# Patient Record
Sex: Female | Born: 1955 | Race: White | Hispanic: No | Marital: Married | Smoking: Never smoker
Health system: Southern US, Community
[De-identification: ages and names within clinical notes are randomized; demographics above are authoritative.]

## PROBLEM LIST (undated history)

## (undated) DIAGNOSIS — M1990 Unspecified osteoarthritis, unspecified site: Secondary | ICD-10-CM

---

## 2014-01-18 ENCOUNTER — Encounter (HOSPITAL_COMMUNITY): Payer: Self-pay | Admitting: Pharmacy Technician

## 2014-01-18 ENCOUNTER — Encounter (HOSPITAL_COMMUNITY): Payer: Self-pay | Admitting: *Deleted

## 2014-01-27 NOTE — H&P (Signed)
TOTAL HIP ADMISSION H&P  Patient is admitted for left total hip arthroplasty, anterior approach.  Subjective:  Chief Complaint: Left hip OA / pain  HPI: Cheryl Stanley is a very pleasant 58 year old female here for evaluation of her left hip for consideration of anterior hip surgery. She has been having problems for years with progressive worsening. She has had previous intraarticular injection which only helped for 2-3 weeks. She recently had a fall on the lateral side of her hip and had a recent troch injection still having some discomfort with the lateral side of her left hip. She is very active with cycling and spin classes. She wants to remain as active as possible but is having limitations due to this pain. It basically is affecting all aspects of her life. She is a thin and healthy appearing female in no acute distress. She does walk with a Trendelenburg gait favoring this left hip. There is also questionable leg length issues on this left side compared to the right. Hip range of motion is limited and painful. Her right hip range of motion is more normal without significant limitations or with any pain. She is otherwise neurovascularly intact.  There is no current active infection.   Risks, benefits and expectations were discussed with the patient.  Risks including but not limited to the risk of anesthesia, blood clots, nerve damage, blood vessel damage, failure of the prosthesis, infection and up to and including death.  Patient understand the risks, benefits and expectations and wishes to proceed with surgery.   D/C Plans:   Home with HHPT  Post-op Meds:     No Rx given  Tranexamic Acid:   To be given  Decadron:    To be given  FYI:    ASA post-op  Norco post-op    Past Medical History  Diagnosis Date  . Arthritis     Past Surgical History  Procedure Laterality Date  . Cesarean section  1989    No prescriptions prior to admission   Not on File   History   Substance Use Topics  . Smoking status: Never Smoker   . Smokeless tobacco: Not on file  . Alcohol Use: Yes     Comment: occassionally-beer and wine    History reviewed. No pertinent family history.   Review of Systems  Constitutional: Negative.   HENT: Negative.   Eyes: Negative.   Respiratory: Negative.   Cardiovascular: Negative.   Gastrointestinal: Negative.   Genitourinary: Negative.   Musculoskeletal: Positive for joint pain.  Skin: Negative.   Neurological: Negative.   Endo/Heme/Allergies: Negative.   Psychiatric/Behavioral: Negative.     Objective:  Physical Exam  Constitutional: She is oriented to person, place, and time. She appears well-developed and well-nourished.  HENT:  Head: Normocephalic and atraumatic.  Mouth/Throat: Oropharynx is clear and moist.  Eyes: Pupils are equal, round, and reactive to light.  Neck: Neck supple. No JVD present. No tracheal deviation present. No thyromegaly present.  Cardiovascular: Normal rate, regular rhythm, normal heart sounds and intact distal pulses.   Respiratory: Effort normal and breath sounds normal. No stridor. No respiratory distress. She has no wheezes.  GI: Soft. There is no tenderness. There is no guarding.  Musculoskeletal:       Left hip: She exhibits decreased range of motion, decreased strength, tenderness and bony tenderness. She exhibits no swelling, no deformity and no laceration.  Lymphadenopathy:    She has no cervical adenopathy.  Neurological: She is alert and oriented  to person, place, and time.  Skin: Skin is warm and dry.  Psychiatric: She has a normal mood and affect.     Labs:  Estimated body mass index is 20.07 kg/(m^2) as calculated from the following:   Height as of this encounter: 5\' 4"  (1.626 m).   Weight as of this encounter: 53.071 kg (117 lb).   Imaging Review Advanced left hip osteoarthritis with loss of joint space in the superior lateral aspect. She has medial osteophytes that  have pushed her hip lateral. She has periarticular osteophytes throughout the hip joint. Her right hip does have moderate joint space narrowing that was reviewed with her today on her radiographs. We did do a single AP pelvis view today. Previous radiographs were not available.   Assessment/Plan:  End stage arthritis, left hip(s)  The patient history, physical examination, clinical judgement of the provider and imaging studies are consistent with end stage degenerative joint disease of the left hip(s) and total hip arthroplasty is deemed medically necessary. The treatment options including medical management, injection therapy, arthroscopy and arthroplasty were discussed at length. The risks and benefits of total hip arthroplasty were presented and reviewed. The risks due to aseptic loosening, infection, stiffness, dislocation/subluxation,  thromboembolic complications and other imponderables were discussed.  The patient acknowledged the explanation, agreed to proceed with the plan and consent was signed. Patient is being admitted for inpatient treatment for surgery, pain control, PT, OT, prophylactic antibiotics, VTE prophylaxis, progressive ambulation and ADL's and discharge planning.The patient is planning to be discharged home with home health services.    Anastasio Auerbach Pebble Botkin   PAC  01/27/2014, 2:43 PM

## 2014-01-29 ENCOUNTER — Inpatient Hospital Stay (HOSPITAL_COMMUNITY): Payer: BC Managed Care – PPO | Admitting: Anesthesiology

## 2014-01-29 ENCOUNTER — Inpatient Hospital Stay (HOSPITAL_COMMUNITY): Payer: BC Managed Care – PPO

## 2014-01-29 ENCOUNTER — Inpatient Hospital Stay (HOSPITAL_COMMUNITY)
Admission: RE | Admit: 2014-01-29 | Discharge: 2014-01-30 | DRG: 470 | Disposition: A | Discharge status: Home Health | Payer: BC Managed Care – PPO | Source: Ambulatory Visit | Attending: Orthopedic Surgery | Admitting: Orthopedic Surgery

## 2014-01-29 ENCOUNTER — Encounter (HOSPITAL_COMMUNITY): Payer: BC Managed Care – PPO | Admitting: Anesthesiology

## 2014-01-29 ENCOUNTER — Encounter (HOSPITAL_COMMUNITY): Payer: Self-pay | Admitting: *Deleted

## 2014-01-29 ENCOUNTER — Encounter (HOSPITAL_COMMUNITY)
Admission: RE | Disposition: A | Discharge status: Home Health | Payer: Self-pay | Source: Ambulatory Visit | Attending: Orthopedic Surgery

## 2014-01-29 DIAGNOSIS — M169 Osteoarthritis of hip, unspecified: Principal | ICD-10-CM | POA: Diagnosis present

## 2014-01-29 DIAGNOSIS — Z96649 Presence of unspecified artificial hip joint: Secondary | ICD-10-CM

## 2014-01-29 DIAGNOSIS — M1610 Unilateral primary osteoarthritis, unspecified hip: Principal | ICD-10-CM | POA: Diagnosis present

## 2014-01-29 DIAGNOSIS — D62 Acute posthemorrhagic anemia: Secondary | ICD-10-CM | POA: Diagnosis not present

## 2014-01-29 HISTORY — PX: TOTAL HIP ARTHROPLASTY: SHX124

## 2014-01-29 HISTORY — DX: Unspecified osteoarthritis, unspecified site: M19.90

## 2014-01-29 LAB — URINALYSIS, ROUTINE W REFLEX MICROSCOPIC
BILIRUBIN URINE: NEGATIVE
Glucose, UA: NEGATIVE mg/dL
Hgb urine dipstick: NEGATIVE
KETONES UR: NEGATIVE mg/dL
Leukocytes, UA: NEGATIVE
Nitrite: NEGATIVE
PH: 6.5 (ref 5.0–8.0)
Protein, ur: NEGATIVE mg/dL
Specific Gravity, Urine: 1.005 (ref 1.005–1.030)
UROBILINOGEN UA: 0.2 mg/dL (ref 0.0–1.0)

## 2014-01-29 LAB — BASIC METABOLIC PANEL
BUN: 17 mg/dL (ref 6–23)
CO2: 23 mEq/L (ref 19–32)
Calcium: 9.5 mg/dL (ref 8.4–10.5)
Chloride: 103 mEq/L (ref 96–112)
Creatinine, Ser: 0.73 mg/dL (ref 0.50–1.10)
Glucose, Bld: 103 mg/dL — ABNORMAL HIGH (ref 70–99)
Potassium: 4.1 mEq/L (ref 3.7–5.3)
SODIUM: 142 meq/L (ref 137–147)

## 2014-01-29 LAB — CBC
HCT: 41.2 % (ref 36.0–46.0)
Hemoglobin: 14.2 g/dL (ref 12.0–15.0)
MCH: 29.8 pg (ref 26.0–34.0)
MCHC: 34.5 g/dL (ref 30.0–36.0)
MCV: 86.6 fL (ref 78.0–100.0)
PLATELETS: 218 10*3/uL (ref 150–400)
RBC: 4.76 MIL/uL (ref 3.87–5.11)
RDW: 12.6 % (ref 11.5–15.5)
WBC: 6.4 10*3/uL (ref 4.0–10.5)

## 2014-01-29 LAB — SURGICAL PCR SCREEN
MRSA, PCR: NEGATIVE
STAPHYLOCOCCUS AUREUS: NEGATIVE

## 2014-01-29 LAB — PROTIME-INR
INR: 1.04 (ref 0.00–1.49)
PROTHROMBIN TIME: 13.4 s (ref 11.6–15.2)

## 2014-01-29 LAB — TYPE AND SCREEN
ABO/RH(D): B POS
Antibody Screen: NEGATIVE

## 2014-01-29 LAB — ABO/RH: ABO/RH(D): B POS

## 2014-01-29 LAB — APTT: aPTT: 27 seconds (ref 24–37)

## 2014-01-29 SURGERY — ARTHROPLASTY, HIP, TOTAL, ANTERIOR APPROACH
Anesthesia: Spinal | Site: Hip | Laterality: Left

## 2014-01-29 MED ORDER — 0.9 % SODIUM CHLORIDE (POUR BTL) OPTIME
TOPICAL | Status: DC | PRN
  Administered 2014-01-29: 1000 mL

## 2014-01-29 MED ORDER — HYDROCODONE-ACETAMINOPHEN 7.5-325 MG PO TABS
1.0000 | ORAL_TABLET | ORAL | Status: DC
  Administered 2014-01-29 – 2014-01-30 (×2): 2 via ORAL
  Filled 2014-01-29 (×4): qty 2

## 2014-01-29 MED ORDER — CEFAZOLIN SODIUM-DEXTROSE 2-3 GM-% IV SOLR
2.0000 g | INTRAVENOUS | Status: AC
  Administered 2014-01-29: 2 g via INTRAVENOUS

## 2014-01-29 MED ORDER — TRANEXAMIC ACID 100 MG/ML IV SOLN
1000.0000 mg | Freq: Once | INTRAVENOUS | Status: AC
  Administered 2014-01-29: 1000 mg via INTRAVENOUS
  Filled 2014-01-29: qty 10

## 2014-01-29 MED ORDER — CEFAZOLIN SODIUM-DEXTROSE 2-3 GM-% IV SOLR
2.0000 g | Freq: Four times a day (QID) | INTRAVENOUS | Status: AC
  Administered 2014-01-30 (×2): 2 g via INTRAVENOUS
  Filled 2014-01-29 (×2): qty 50

## 2014-01-29 MED ORDER — METOCLOPRAMIDE HCL 10 MG PO TABS: 5.0000 mg | ORAL_TABLET | Freq: Three times a day (TID) | ORAL | Status: DC | PRN

## 2014-01-29 MED ORDER — HYDROMORPHONE HCL PF 1 MG/ML IJ SOLN
0.2500 mg | INTRAMUSCULAR | Status: DC | PRN
  Administered 2014-01-29: 0.25 mg via INTRAVENOUS
  Administered 2014-01-29: 0.5 mg via INTRAVENOUS

## 2014-01-29 MED ORDER — PROPOFOL INFUSION 10 MG/ML OPTIME
INTRAVENOUS | Status: DC | PRN
  Administered 2014-01-29: 100 ug/kg/min via INTRAVENOUS

## 2014-01-29 MED ORDER — FENTANYL CITRATE 0.05 MG/ML IJ SOLN
INTRAMUSCULAR | Status: DC | PRN
  Administered 2014-01-29: 100 ug via INTRAVENOUS

## 2014-01-29 MED ORDER — HYDROMORPHONE HCL PF 1 MG/ML IJ SOLN
INTRAMUSCULAR | Status: AC
  Filled 2014-01-29: qty 1

## 2014-01-29 MED ORDER — PROPOFOL 10 MG/ML IV BOLUS
INTRAVENOUS | Status: AC
  Filled 2014-01-29: qty 20

## 2014-01-29 MED ORDER — LACTATED RINGERS IV SOLN
INTRAVENOUS | Status: DC
  Administered 2014-01-29 (×2): via INTRAVENOUS
  Administered 2014-01-29: 1000 mL via INTRAVENOUS

## 2014-01-29 MED ORDER — MIDAZOLAM HCL 5 MG/5ML IJ SOLN
INTRAMUSCULAR | Status: DC | PRN
  Administered 2014-01-29: 2 mg via INTRAVENOUS

## 2014-01-29 MED ORDER — DEXAMETHASONE SODIUM PHOSPHATE 10 MG/ML IJ SOLN
10.0000 mg | Freq: Once | INTRAMUSCULAR | Status: AC
  Administered 2014-01-29: 10 mg via INTRAVENOUS

## 2014-01-29 MED ORDER — CEFAZOLIN SODIUM-DEXTROSE 2-3 GM-% IV SOLR
INTRAVENOUS | Status: AC
  Filled 2014-01-29: qty 50

## 2014-01-29 MED ORDER — BIOTIN 5000 MCG PO CAPS: 5000.0000 ug | ORAL_CAPSULE | Freq: Every morning | ORAL | Status: DC

## 2014-01-29 MED ORDER — BISACODYL 10 MG RE SUPP
10.0000 mg | Freq: Every day | RECTAL | Status: DC | PRN
Start: 2014-01-29 — End: 2014-01-30

## 2014-01-29 MED ORDER — METOCLOPRAMIDE HCL 5 MG/ML IJ SOLN
5.0000 mg | Freq: Three times a day (TID) | INTRAMUSCULAR | Status: DC | PRN
  Administered 2014-01-30: 10 mg via INTRAVENOUS
  Filled 2014-01-29: qty 2

## 2014-01-29 MED ORDER — BUPIVACAINE HCL (PF) 0.75 % IJ SOLN
INTRAMUSCULAR | Status: DC | PRN
  Administered 2014-01-29: 2 mL

## 2014-01-29 MED ORDER — ONDANSETRON HCL 4 MG/2ML IJ SOLN
INTRAMUSCULAR | Status: DC | PRN
  Administered 2014-01-29: 4 mg via INTRAVENOUS

## 2014-01-29 MED ORDER — SODIUM CHLORIDE 0.9 % IV SOLN
100.0000 mL/h | INTRAVENOUS | Status: DC
  Administered 2014-01-29: 100 mL/h via INTRAVENOUS
  Filled 2014-01-29 (×3): qty 1000

## 2014-01-29 MED ORDER — PHENYLEPHRINE HCL 10 MG/ML IJ SOLN
INTRAMUSCULAR | Status: DC | PRN
  Administered 2014-01-29: 40 ug via INTRAVENOUS
  Administered 2014-01-29: 80 ug via INTRAVENOUS

## 2014-01-29 MED ORDER — DOCUSATE SODIUM 100 MG PO CAPS
100.0000 mg | ORAL_CAPSULE | Freq: Two times a day (BID) | ORAL | Status: DC
  Administered 2014-01-29 – 2014-01-30 (×2): 100 mg via ORAL

## 2014-01-29 MED ORDER — HYDROMORPHONE HCL PF 1 MG/ML IJ SOLN
0.5000 mg | INTRAMUSCULAR | Status: DC | PRN
  Administered 2014-01-30: 1 mg via INTRAVENOUS
  Filled 2014-01-29: qty 1

## 2014-01-29 MED ORDER — STERILE WATER FOR IRRIGATION IR SOLN
Status: DC | PRN
  Administered 2014-01-29: 1500 mL

## 2014-01-29 MED ORDER — METHOCARBAMOL 100 MG/ML IJ SOLN
500.0000 mg | Freq: Four times a day (QID) | INTRAVENOUS | Status: DC | PRN
  Administered 2014-01-29: 500 mg via INTRAVENOUS
  Filled 2014-01-29: qty 5

## 2014-01-29 MED ORDER — ONDANSETRON HCL 4 MG/2ML IJ SOLN
INTRAMUSCULAR | Status: AC
  Filled 2014-01-29: qty 2

## 2014-01-29 MED ORDER — ALUM & MAG HYDROXIDE-SIMETH 200-200-20 MG/5ML PO SUSP: 30.0000 mL | ORAL | Status: DC | PRN

## 2014-01-29 MED ORDER — FLEET ENEMA 7-19 GM/118ML RE ENEM: 1.0000 | ENEMA | Freq: Once | RECTAL | Status: AC | PRN

## 2014-01-29 MED ORDER — METHOCARBAMOL 500 MG PO TABS
500.0000 mg | ORAL_TABLET | Freq: Four times a day (QID) | ORAL | Status: DC | PRN
  Administered 2014-01-30: 500 mg via ORAL
  Filled 2014-01-29: qty 1

## 2014-01-29 MED ORDER — PHENOL 1.4 % MT LIQD: 1.0000 | OROMUCOSAL | Status: DC | PRN

## 2014-01-29 MED ORDER — CHLORHEXIDINE GLUCONATE 4 % EX LIQD: 60.0000 mL | Freq: Once | CUTANEOUS | Status: DC

## 2014-01-29 MED ORDER — CELECOXIB 200 MG PO CAPS
200.0000 mg | ORAL_CAPSULE | Freq: Two times a day (BID) | ORAL | Status: DC
  Administered 2014-01-29 – 2014-01-30 (×2): 200 mg via ORAL
  Filled 2014-01-29 (×3): qty 1

## 2014-01-29 MED ORDER — PROMETHAZINE HCL 25 MG/ML IJ SOLN: 6.2500 mg | INTRAMUSCULAR | Status: DC | PRN

## 2014-01-29 MED ORDER — DEXAMETHASONE SODIUM PHOSPHATE 10 MG/ML IJ SOLN
10.0000 mg | Freq: Once | INTRAMUSCULAR | Status: DC
  Filled 2014-01-29: qty 1

## 2014-01-29 MED ORDER — FENTANYL CITRATE 0.05 MG/ML IJ SOLN
INTRAMUSCULAR | Status: AC
  Filled 2014-01-29: qty 2

## 2014-01-29 MED ORDER — MUPIROCIN 2 % EX OINT
TOPICAL_OINTMENT | Freq: Two times a day (BID) | CUTANEOUS | Status: DC
Start: 2014-01-29 — End: 2014-01-29
  Administered 2014-01-29: 1 via NASAL
  Filled 2014-01-29: qty 22

## 2014-01-29 MED ORDER — ZOLPIDEM TARTRATE 5 MG PO TABS: 5.0000 mg | ORAL_TABLET | Freq: Every evening | ORAL | Status: DC | PRN

## 2014-01-29 MED ORDER — MENTHOL 3 MG MT LOZG
1.0000 | LOZENGE | OROMUCOSAL | Status: DC | PRN
Start: 2014-01-29 — End: 2014-01-30

## 2014-01-29 MED ORDER — MIDAZOLAM HCL 2 MG/2ML IJ SOLN
INTRAMUSCULAR | Status: AC
  Filled 2014-01-29: qty 2

## 2014-01-29 MED ORDER — ONDANSETRON HCL 4 MG PO TABS
4.0000 mg | ORAL_TABLET | Freq: Four times a day (QID) | ORAL | Status: DC | PRN
  Administered 2014-01-30: 4 mg via ORAL
  Filled 2014-01-29: qty 1

## 2014-01-29 MED ORDER — FERROUS SULFATE 325 (65 FE) MG PO TABS
325.0000 mg | ORAL_TABLET | Freq: Three times a day (TID) | ORAL | Status: DC
  Filled 2014-01-29 (×4): qty 1

## 2014-01-29 MED ORDER — ONDANSETRON HCL 4 MG/2ML IJ SOLN
4.0000 mg | Freq: Four times a day (QID) | INTRAMUSCULAR | Status: DC | PRN
  Administered 2014-01-30: 4 mg via INTRAVENOUS
  Filled 2014-01-29: qty 2

## 2014-01-29 MED ORDER — DIPHENHYDRAMINE HCL 25 MG PO CAPS: 25.0000 mg | ORAL_CAPSULE | Freq: Four times a day (QID) | ORAL | Status: DC | PRN

## 2014-01-29 MED ORDER — POLYETHYLENE GLYCOL 3350 17 G PO PACK
17.0000 g | PACK | Freq: Two times a day (BID) | ORAL | Status: DC
  Administered 2014-01-29 – 2014-01-30 (×2): 17 g via ORAL

## 2014-01-29 MED ORDER — PHENYLEPHRINE 40 MCG/ML (10ML) SYRINGE FOR IV PUSH (FOR BLOOD PRESSURE SUPPORT)
PREFILLED_SYRINGE | INTRAVENOUS | Status: AC
  Filled 2014-01-29: qty 10

## 2014-01-29 MED ORDER — ASPIRIN EC 325 MG PO TBEC
325.0000 mg | DELAYED_RELEASE_TABLET | Freq: Two times a day (BID) | ORAL | Status: DC
  Administered 2014-01-30: 325 mg via ORAL
  Filled 2014-01-29 (×3): qty 1

## 2014-01-29 SURGICAL SUPPLY — 38 items
BAG ZIPLOCK 12X15 (MISCELLANEOUS) IMPLANT
BLADE SAW SGTL 18X1.27X75 (BLADE) ×2 IMPLANT
BLADE SAW SGTL 18X1.27X75MM (BLADE) ×1
CAPT HIP PF COP ×3 IMPLANT
DERMABOND ADVANCED (GAUZE/BANDAGES/DRESSINGS) ×2
DERMABOND ADVANCED .7 DNX12 (GAUZE/BANDAGES/DRESSINGS) ×1 IMPLANT
DRAPE C-ARM 42X120 X-RAY (DRAPES) ×3 IMPLANT
DRAPE STERI IOBAN 125X83 (DRAPES) ×3 IMPLANT
DRAPE U-SHAPE 47X51 STRL (DRAPES) ×9 IMPLANT
DRSG AQUACEL AG ADV 3.5X10 (GAUZE/BANDAGES/DRESSINGS) ×3 IMPLANT
DRSG TEGADERM 4X4.75 (GAUZE/BANDAGES/DRESSINGS) IMPLANT
DURAPREP 26ML APPLICATOR (WOUND CARE) ×3 IMPLANT
ELECT BLADE TIP CTD 4 INCH (ELECTRODE) ×3 IMPLANT
ELECT REM PT RETURN 9FT ADLT (ELECTROSURGICAL) ×3
ELECTRODE REM PT RTRN 9FT ADLT (ELECTROSURGICAL) ×1 IMPLANT
EVACUATOR 1/8 PVC DRAIN (DRAIN) IMPLANT
FACESHIELD LNG OPTICON STERILE (SAFETY) ×12 IMPLANT
GAUZE SPONGE 2X2 8PLY STRL LF (GAUZE/BANDAGES/DRESSINGS) IMPLANT
GLOVE BIOGEL PI IND STRL 7.5 (GLOVE) ×1 IMPLANT
GLOVE BIOGEL PI IND STRL 8 (GLOVE) ×1 IMPLANT
GLOVE BIOGEL PI INDICATOR 7.5 (GLOVE) ×2
GLOVE BIOGEL PI INDICATOR 8 (GLOVE) ×2
GLOVE ECLIPSE 8.0 STRL XLNG CF (GLOVE) ×3 IMPLANT
GLOVE ORTHO TXT STRL SZ7.5 (GLOVE) ×6 IMPLANT
GOWN SPEC L3 XXLG W/TWL (GOWN DISPOSABLE) ×3 IMPLANT
GOWN STRL REUS W/TWL LRG LVL3 (GOWN DISPOSABLE) ×3 IMPLANT
KIT BASIN OR (CUSTOM PROCEDURE TRAY) ×3 IMPLANT
PACK TOTAL JOINT (CUSTOM PROCEDURE TRAY) ×3 IMPLANT
PADDING CAST COTTON 6X4 STRL (CAST SUPPLIES) ×3 IMPLANT
SPONGE GAUZE 2X2 STER 10/PKG (GAUZE/BANDAGES/DRESSINGS)
SUT MNCRL AB 4-0 PS2 18 (SUTURE) ×3 IMPLANT
SUT VIC AB 1 CT1 36 (SUTURE) ×9 IMPLANT
SUT VIC AB 2-0 CT1 27 (SUTURE) ×4
SUT VIC AB 2-0 CT1 TAPERPNT 27 (SUTURE) ×2 IMPLANT
SUT VLOC 180 0 24IN GS25 (SUTURE) ×3 IMPLANT
TOWEL OR 17X26 10 PK STRL BLUE (TOWEL DISPOSABLE) ×3 IMPLANT
TOWEL OR NON WOVEN STRL DISP B (DISPOSABLE) IMPLANT
TRAY FOLEY CATH 14FRSI W/METER (CATHETERS) IMPLANT

## 2014-01-29 NOTE — Progress Notes (Signed)
PHARMACIST - PHYSICIAN ORDER COMMUNICATION  CONCERNING: P&T Medication Policy on Herbal Medications  DESCRIPTION:  This patient's order for:  Biotin  has been noted.  This product(s) is classified as an "herbal" or natural product. Due to a lack of definitive safety studies or FDA approval, nonstandard manufacturing practices, plus the potential risk of unknown drug-drug interactions while on inpatient medications, the Pharmacy and Therapeutics Committee does not permit the use of "herbal" or natural products of this type within [facility].   ACTION TAKEN: The pharmacy department is unable to verify this order at this time and your patient has been informed of this safety policy. Please reevaluate patient's clinical condition at discharge and address if the herbal or natural product(s) should be resumed at that time.  Lorenza EvangelistGreen, Okechukwu Regnier R 01/29/2014 10:36 PM

## 2014-01-29 NOTE — Anesthesia Postprocedure Evaluation (Signed)
  Anesthesia Post-op Note  Patient: Cheryl Stanley  Procedure(s) Performed: Procedure(s) (LRB): LEFT TOTAL HIP ARTHROPLASTY ANTERIOR APPROACH (Left)  Patient Location: PACU  Anesthesia Type: Spinal  Level of Consciousness: awake and alert   Airway and Oxygen Therapy: Patient Spontanous Breathing  Post-op Pain: mild  Post-op Assessment: Post-op Vital signs reviewed, Patient's Cardiovascular Status Stable, Respiratory Function Stable, Patent Airway and No signs of Nausea or vomiting  Last Vitals:  Filed Vitals:   01/29/14 1926  BP: 115/76  Pulse: 61  Temp: 36.4 C  Resp: 16    Post-op Vital Signs: stable   Complications: No apparent anesthesia complications

## 2014-01-29 NOTE — Anesthesia Procedure Notes (Signed)
Spinal  Patient location during procedure: OR Start time: 01/29/2014 5:25 PM Staffing Anesthesiologist: ROSE, Greggory StallionGEORGE CRNA/Resident: [place]UST[place], Schuyler Behan C Performed by: resident/CRNA  Preanesthetic Checklist Completed: patient identified, site marked, surgical consent, pre-op evaluation, timeout performed, IV checked, risks and benefits discussed and monitors and equipment checked Spinal Block Patient position: sitting Prep: Betadine Patient monitoring: heart rate, cardiac monitor, continuous pulse ox and blood pressure Approach: midline Location: L3-4 Injection technique: single-shot Needle Needle type: Sprotte  Needle gauge: 24 G Assessment Sensory level: T6

## 2014-01-29 NOTE — Interval H&P Note (Signed)
History and Physical Interval Note:  01/29/2014 4:23 PM  Cheryl Barmanatricia Toste  has presented today for surgery, with the diagnosis of OSTEOARTHRITIS LEFT HIP  The various methods of treatment have been discussed with the patient and family. After consideration of risks, benefits and other options for treatment, the patient has consented to  Procedure(s): LEFT TOTAL HIP ARTHROPLASTY ANTERIOR APPROACH (Left) as a surgical intervention .  The patient's history has been reviewed, patient examined, no change in status, stable for surgery.  I have reviewed the patient's chart and labs.  Questions were answered to the patient's satisfaction.     Shelda PalLIN,Adlai Sinning D

## 2014-01-29 NOTE — Anesthesia Preprocedure Evaluation (Signed)
Anesthesia Evaluation  Patient identified by MRN, date of birth, ID band Patient awake    Reviewed: Allergy & Precautions, H&P , NPO status , Patient's Chart, lab work & pertinent test results  Airway Mallampati: II TM Distance: >3 FB Neck ROM: Full    Dental no notable dental hx.    Pulmonary neg pulmonary ROS,  breath sounds clear to auscultation  Pulmonary exam normal       Cardiovascular negative cardio ROS  Rhythm:Regular Rate:Normal     Neuro/Psych negative neurological ROS  negative psych ROS   GI/Hepatic negative GI ROS, Neg liver ROS,   Endo/Other  negative endocrine ROS  Renal/GU negative Renal ROS  negative genitourinary   Musculoskeletal negative musculoskeletal ROS (+)   Abdominal   Peds negative pediatric ROS (+)  Hematology negative hematology ROS (+)   Anesthesia Other Findings   Reproductive/Obstetrics negative OB ROS                           Anesthesia Physical Anesthesia Plan  ASA: I  Anesthesia Plan: Spinal   Post-op Pain Management:    Induction: Intravenous  Airway Management Planned: Simple Face Mask  Additional Equipment:   Intra-op Plan:   Post-operative Plan:   Informed Consent: I have reviewed the patients History and Physical, chart, labs and discussed the procedure including the risks, benefits and alternatives for the proposed anesthesia with the patient or authorized representative who has indicated his/her understanding and acceptance.   Dental advisory given  Plan Discussed with: CRNA and Surgeon  Anesthesia Plan Comments:         Anesthesia Quick Evaluation  

## 2014-01-29 NOTE — Transfer of Care (Signed)
Immediate Anesthesia Transfer of Care Note  Patient: Cheryl Stanley  Procedure(s) Performed: Procedure(s): LEFT TOTAL HIP ARTHROPLASTY ANTERIOR APPROACH (Left)  Patient Location: PACU  Anesthesia Type:Regional  Level of Consciousness: awake, alert  and oriented  Airway & Oxygen Therapy: Patient Spontanous Breathing and Patient connected to face mask oxygen  Post-op Assessment: Report given to PACU RN and Post -op Vital signs reviewed and stable  Post vital signs: Reviewed and stable  Complications: No apparent anesthesia complications

## 2014-01-29 NOTE — Op Note (Signed)
NAME:  Cheryl Stanley                ACCOUNT NO.: [id number]      MEDICAL RECORD NO.: [id number]      [facility]      PHYSICIAN:  Durene RomansLIN,Chukwudi Ewen D  DATE OF BIRTH:  06/09/1956     DATE OF PROCEDURE:  01/29/2014                                 OPERATIVE REPORT         PREOPERATIVE DIAGNOSIS: Left  hip osteoarthritis.      POSTOPERATIVE DIAGNOSIS:  Left hip osteoarthritis.      PROCEDURE:  Left total hip replacement through an anterior approach   utilizing DePuy THR system, component size 50mm pinnacle cup, a size 32+4 neutral   Altrex liner, a size 1 Hi Tri Lock stem with a 32+1 delta ceramic   ball.      SURGEON:  Madlyn FrankelMatthew D. Charlann Boxerlin, M.D.      ASSISTANT:  Lanney GinsMatthew Babish, PA-C     ANESTHESIA:  Spinal.      SPECIMENS:  None.      COMPLICATIONS:  None.      BLOOD LOSS:  400 cc     DRAINS:  None.      INDICATION OF THE PROCEDURE:  Cheryl Barmanatricia Stanley is a 58 y.o. female who had   presented to office for evaluation of left hip pain.  Radiographs revealed   progressive degenerative changes with bone-on-bone   articulation to the  hip joint.  The patient had painful limited range of   motion significantly affecting their overall quality of life.  The patient was failing to    respond to conservative measures, and at this point was ready   to proceed with more definitive measures.  The patient has noted progressive   degenerative changes in his hip, progressive problems and dysfunction   with regarding the hip prior to surgery.  Consent was obtained for   benefit of pain relief.  Specific risk of infection, DVT, component   failure, dislocation, need for revision surgery, as well discussion of   the anterior versus posterior approach were reviewed.  Consent was   obtained for benefit of anterior pain relief through an anterior   approach.      PROCEDURE IN DETAIL:  The patient was brought to operative theater.   Once adequate anesthesia, preoperative  antibiotics, 2gm Ancef administered.   The patient was positioned supine on the OSI Hanna table.  Once adequate   padding of boney process was carried out, we had predraped out the hip, and  used fluoroscopy to confirm orientation of the pelvis and position.      The left hip was then prepped and draped from proximal iliac crest to   mid thigh with shower curtain technique.      Time-out was performed identifying the patient, planned procedure, and   extremity.     An incision was then made 2 cm distal and lateral to the   anterior superior iliac spine extending over the orientation of the   tensor fascia lata muscle and sharp dissection was carried down to the   fascia of the muscle and protractor placed in the soft tissues.      The fascia was then incised.  The muscle belly was identified and swept   laterally  and retractor placed along the superior neck.  Following   cauterization of the circumflex vessels and removing some pericapsular   fat, a second cobra retractor was placed on the inferior neck.  A third   retractor was placed on the anterior acetabulum after elevating the   anterior rectus.  A L-capsulotomy was along the line of the   superior neck to the trochanteric fossa, then extended proximally and   distally.  Tag sutures were placed and the retractors were then placed   intracapsular.  We then identified the trochanteric fossa and   orientation of my neck cut, confirmed this radiographically   and then made a neck osteotomy with the femur on traction.  The femoral   head was removed without difficulty or complication.  Traction was let   off and retractors were placed posterior and anterior around the   acetabulum.      The labrum and foveal tissue were debrided.  I began reaming with a 45mm   reamer and reamed up to 49mm reamer with good bony bed preparation and a 50   cup was chosen.  The final 50mm Pinnacle cup was then impacted under fluoroscopy  to confirm the  depth of penetration and orientation with respect to   abduction.  A screw was placed followed by the hole eliminator.  The final   32+4 neutral Altrex liner was impacted with good visualized rim fit.  The cup was positioned anatomically within the acetabular portion of the pelvis.      At this point, the femur was rolled at 80 degrees.  Further capsule was   released off the inferior aspect of the femoral neck.  I then   released the superior capsule proximally.  The hook was placed laterally   along the femur and elevated manually and held in position with the bed   hook.  The leg was then extended and adducted with the leg rolled to 100   degrees of external rotation.  Once the proximal femur was fully   exposed, I used a box osteotome to set orientation.  I then began   broaching with the starting chili pepper broach and passed this by hand and then broached up to 1.  With the 1 broach in place I chose a high offset neck and did a trial reduction.  The offset was appropriate, leg lengths   appeared to be equal, confirmed radiographically.   Given these findings, I went ahead and dislocated the hip, repositioned all   retractors and positioned the right hip in the extended and abducted position.  The final 1 Hi Tri Lock stem was   chosen and it was impacted down to the level of neck cut.  Based on this   and the trial reduction, a 32+1 delta ceramic ball was chosen and   impacted onto a clean and dry trunnion, and the hip was reduced.  The   hip had been irrigated throughout the case again at this point.  I did   reapproximate the superior capsular leaflet to the anterior leaflet   using #1 Vicryl.  The fascia of the   tensor fascia lata muscle was then reapproximated using #1 Vicryl and #0 V-lock.  The   remaining wound was closed with 2-0 Vicryl and running 4-0 Monocryl.   The hip was cleaned, dried, and dressed sterilely using Dermabond and   Aquacel dressing.  She was then brought    to recovery room in stable  condition tolerating the procedure well.    Lanney Gins, PA-C was present for the entirety of the case involved from   preoperative positioning, perioperative retractor management, general   facilitation of the case, as well as primary wound closure as assistant.            Madlyn Frankel Charlann Boxer, M.D.        01/29/2014 8:39 PM

## 2014-01-30 ENCOUNTER — Encounter (HOSPITAL_COMMUNITY): Payer: Self-pay | Admitting: Orthopedic Surgery

## 2014-01-30 DIAGNOSIS — D62 Acute posthemorrhagic anemia: Secondary | ICD-10-CM | POA: Diagnosis not present

## 2014-01-30 LAB — BASIC METABOLIC PANEL
BUN: 15 mg/dL (ref 6–23)
CHLORIDE: 99 meq/L (ref 96–112)
CO2: 26 mEq/L (ref 19–32)
Calcium: 8.4 mg/dL (ref 8.4–10.5)
Creatinine, Ser: 0.69 mg/dL (ref 0.50–1.10)
GFR calc non Af Amer: >90 mL/min (ref >90)
Glucose, Bld: 151 mg/dL — ABNORMAL HIGH (ref 70–99)
POTASSIUM: 4.8 meq/L (ref 3.7–5.3)
Sodium: 135 mEq/L — ABNORMAL LOW (ref 137–147)

## 2014-01-30 LAB — CBC
HCT: 34.9 % — ABNORMAL LOW (ref 36.0–46.0)
Hemoglobin: 11.7 g/dL — ABNORMAL LOW (ref 12.0–15.0)
MCH: 29.5 pg (ref 26.0–34.0)
MCHC: 33.5 g/dL (ref 30.0–36.0)
MCV: 88.1 fL (ref 78.0–100.0)
PLATELETS: 169 10*3/uL (ref 150–400)
RBC: 3.96 MIL/uL (ref 3.87–5.11)
RDW: 12.6 % (ref 11.5–15.5)
WBC: 10.8 10*3/uL — AB (ref 4.0–10.5)

## 2014-01-30 MED ORDER — FERROUS SULFATE 325 (65 FE) MG PO TABS: 325.0000 mg | ORAL_TABLET | Freq: Three times a day (TID) | ORAL | Status: AC

## 2014-01-30 MED ORDER — DSS 100 MG PO CAPS: 100.0000 mg | ORAL_CAPSULE | Freq: Two times a day (BID) | ORAL | Status: AC

## 2014-01-30 MED ORDER — METHOCARBAMOL 500 MG PO TABS: 500.0000 mg | ORAL_TABLET | Freq: Four times a day (QID) | ORAL | Status: DC | PRN

## 2014-01-30 MED ORDER — METHOCARBAMOL 500 MG PO TABS: 500.0000 mg | ORAL_TABLET | Freq: Four times a day (QID) | ORAL | Status: AC | PRN

## 2014-01-30 MED ORDER — PROMETHAZINE HCL 25 MG/ML IJ SOLN: 6.2500 mg | Freq: Four times a day (QID) | INTRAMUSCULAR | Status: DC | PRN

## 2014-01-30 MED ORDER — ASPIRIN 325 MG PO TBEC: 325.0000 mg | DELAYED_RELEASE_TABLET | Freq: Two times a day (BID) | ORAL | Status: AC

## 2014-01-30 MED ORDER — HYDROCODONE-ACETAMINOPHEN 7.5-325 MG PO TABS: 1.0000 | ORAL_TABLET | ORAL | Status: DC | PRN

## 2014-01-30 MED ORDER — TRAMADOL HCL 50 MG PO TABS: 50.0000 mg | ORAL_TABLET | Freq: Four times a day (QID) | ORAL | Status: AC | PRN

## 2014-01-30 MED ORDER — POLYETHYLENE GLYCOL 3350 17 G PO PACK: 17.0000 g | PACK | Freq: Two times a day (BID) | ORAL | Status: AC

## 2014-01-30 MED ORDER — HYDROCODONE-ACETAMINOPHEN 7.5-325 MG PO TABS: 1.0000 | ORAL_TABLET | ORAL | Status: AC | PRN

## 2014-01-30 MED ORDER — TRAMADOL HCL 50 MG PO TABS
50.0000 mg | ORAL_TABLET | Freq: Four times a day (QID) | ORAL | Status: DC | PRN
  Administered 2014-01-30: 100 mg via ORAL
  Filled 2014-01-30: qty 2

## 2014-01-30 NOTE — Plan of Care (Signed)
Problem: Consults Goal: Diagnosis- Total Joint Replacement Outcome: Completed/Met Date Met:  01/30/14 Primary Total Hip LEFT, Anterior

## 2014-01-30 NOTE — Evaluation (Signed)
Occupational Therapy Evaluation Patient Details Name: Cheryl Stanley MRN: [id number] DOB: 08/04/1956 Today's Date: 01/30/2014 Time: 4098-1191 OT Time Calculation (min): 16 min  OT Assessment / Plan / Recommendation History of present illness  pt was admitted for L THA, DA   Clinical Impression   Pt was admitted for the above. She was limited in eval by nausea/lightheadedness.  Will benefit from one more OT session to complete education.      OT Assessment  Patient needs continued OT Services    Follow Up Recommendations  No OT follow up    Barriers to Discharge      Equipment Recommendations  None recommended by OT (likely)    Recommendations for Other Services    Frequency  Min 2X/week    Precautions / Restrictions Precautions Precautions: Fall Restrictions Weight Bearing Restrictions: No   Pertinent Vitals/Pain 5/10 L hip; after sitting for about 5 minutes 109/69. Delay in finding dynamap--had been lightheaded after standing    ADL  Grooming: Set up Where Assessed - Grooming: Unsupported sitting Upper Body Bathing: Set up Where Assessed - Upper Body Bathing: Unsupported sitting Lower Body Bathing: Minimal assistance Where Assessed - Lower Body Bathing: Supported sit to stand Upper Body Dressing: Set up Where Assessed - Upper Body Dressing: Unsupported sitting Lower Body Dressing: Moderate assistance Where Assessed - Lower Body Dressing: Supported sit to stand Transfers/Ambulation Related to ADLs: when pt stood up she first felt nauseas and then dizzy.  returned to sitting.  delay in getting dynamap:  see vitals above under pain ADL Comments: husband will help with adls.  she does not have any ae    OT Diagnosis: Generalized weakness  OT Problem List: Decreased strength;Decreased activity tolerance;Cardiopulmonary status limiting activity;Pain OT Treatment Interventions: Self-care/ADL training;DME and/or AE instruction;Patient/family education   OT Goals(Current  goals can be found in the care plan section) Acute Rehab OT Goals Patient Stated Goal: none stated OT Goal Formulation: With patient Time For Goal Achievement: 02/06/14 Potential to Achieve Goals: Good ADL Goals Pt Will Perform Grooming: with supervision;standing Pt Will Transfer to Toilet: with min guard assist;ambulating (high commode) Pt Will Perform Tub/Shower Transfer: with min guard assist;ambulating;Shower transfer  Visit Information  Last OT Received On: 01/30/14 Assistance Needed: +1       Prior Functioning     Home Living Family/patient expects to be discharged to:: Private residence Living Arrangements: Spouse/significant other Available Help at Discharge: Family Type of Home: House Home Access: Stairs to enter Secretary/administrator of Steps: 3 Entrance Stairs-Rails: None Home Layout: One level Home Equipment: Shower seat - built in Additional Comments: just replaced commode with comfort height Prior Function Level of Independence: Independent Communication Communication: No difficulties Dominant Hand: Right         Vision/Perception     Cognition  Cognition Arousal/Alertness: Awake/alert Behavior During Therapy: WFL for tasks assessed/performed Overall Cognitive Status: Within Functional Limits for tasks assessed    Extremity/Trunk Assessment Upper Extremity Assessment Upper Extremity Assessment: Overall WFL for tasks assessed Lower Extremity Assessment Lower Extremity Assessment: LLE deficits/detail LLE Deficits / Details: Hip strength 2+/5 with AAROM at hip to 90 flex and 20 abd Cervical / Trunk Assessment Cervical / Trunk Assessment: Normal     Mobility  Transfers Overall transfer level: Needs assistance Equipment used: Rolling walker (2 wheeled) Transfers: Sit to/from Stand Sit to Stand: Min guard General transfer comment: cues for hand placement     Exercise    Balance     End of Session OT -  End of Session Activity Tolerance:   (limited by nausea/dizziness) Patient left: in chair;with call bell/phone within reach;with family/visitor present  GO     Roderic Lammert 01/30/2014, 12:48 PM Marica OtterMaryellen Oda Stanley, OTR/L [phone] 01/30/2014

## 2014-01-30 NOTE — Progress Notes (Signed)
   Subjective: 1 Day Post-Op Procedure(s) (LRB): LEFT TOTAL HIP ARTHROPLASTY ANTERIOR APPROACH (Left)   Patient reports pain as mild, pain controlled. No events throughout the night. Ready to be discharged home if she does well with PT and pain stays controlled.   Objective:   VITALS:   Filed Vitals:   01/30/14 0543  BP: 107/68  Pulse: 65  Temp: 98.2 F (36.8 C)  Resp: 16    Neurovascular intact Dorsiflexion/Plantar flexion intact Incision: dressing C/D/I No cellulitis present Compartment soft  LABS  Recent Labs  01/29/14 1425 01/30/14 0422  HGB 14.2 11.7*  HCT 41.2 34.9*  WBC 6.4 10.8*  PLT 218 169     Recent Labs  01/29/14 1425 01/30/14 0422  NA 142 135*  K 4.1 4.8  BUN 17 15  CREATININE 0.73 0.69  GLUCOSE 103* 151*     Assessment/Plan: 1 Day Post-Op Procedure(s) (LRB): LEFT TOTAL HIP ARTHROPLASTY ANTERIOR APPROACH (Left) Foley cath d/c'ed Advance diet Up with therapy D/C IV fluids Discharge home with home health [facility]. Follow up with OLIN,Kelleen Stolze D in 2 weeks.  Contact information:  [facility] [address], [address] [place] [id number] [phone]    Expected ABLA  Treated with iron and will observe      Anastasio AuerbachMatthew S. Zephyra Bernardi   PAC  01/30/2014, 8:59 AM

## 2014-01-30 NOTE — Discharge Summary (Signed)
Physician Discharge Summary  Patient ID: Cheryl Stanley MRN: [id number] DOB/AGE: 59-Feb-1957 59 y.o.  Admit date: 01/29/2014 Discharge date: 01/30/2014   Procedures:  Procedure(s) (LRB): LEFT TOTAL HIP ARTHROPLASTY ANTERIOR APPROACH (Left)  Attending Physician:  Dr. Durene Romans   Admission Diagnoses:   Left hip OA / pain  Discharge Diagnoses:  Principal Problem:   S/P left THA, AA Active Problems:   Expected blood loss anemia  Past Medical History  Diagnosis Date  . Arthritis     HPI: Cheryl Stanley is a very pleasant 58 year old female here for evaluation of her left hip for consideration of anterior hip surgery. She has been having problems for years with progressive worsening. She has had previous intraarticular injection which only helped for 2-3 weeks. She recently had a fall on the lateral side of her hip and had a recent troch injection still having some discomfort with the lateral side of her left hip. She is very active with cycling and spin classes. She wants to remain as active as possible but is having limitations due to this pain. It basically is affecting all aspects of her life. She is a thin and healthy appearing female in no acute distress. She does walk with a Trendelenburg gait favoring this left hip. There is also questionable leg length issues on this left side compared to the right. Hip range of motion is limited and painful. Her right hip range of motion is more normal without significant limitations or with any pain. She is otherwise neurovascularly intact. There is no current active infection. Risks, benefits and expectations were discussed with the patient. Risks including but not limited to the risk of anesthesia, blood clots, nerve damage, blood vessel damage, failure of the prosthesis, infection and up to and including death. Patient understand the risks, benefits and expectations and wishes to proceed with surgery.   PCP: Pcp Not In System   Discharged  Condition: good  Hospital Course:  Patient underwent the above stated procedure on 01/29/2014. Patient tolerated the procedure well and brought to the recovery room in good condition and subsequently to the floor.  POD #1 BP: 107/68 ; Pulse: 65 ; Temp: 98.2 F (36.8 C) ; Resp: 16  Patient reports pain as mild, pain controlled. No events throughout the night. Ready to be discharged home.  Neurovascular intact, dorsiflexion/plantar flexion intact, incision: dressing C/D/I, no cellulitis present and compartment soft.   LABS  Basename    HGB  11.7  HCT  34.9    Discharge Exam: General appearance: alert, cooperative and no distress Extremities: Homans sign is negative, no sign of DVT, no edema, redness or tenderness in the calves or thighs and no ulcers, gangrene or trophic changes  Disposition:   Home or Self Care with follow up in 2 weeks   Follow-up Information   Follow up with Shelda Pal, MD. Schedule an appointment as soon as possible for a visit in 2 weeks.   Specialty:  Orthopedic Surgery   Contact information:   [address] [place] [address] [phone]       Discharge Orders   Future Orders Complete By Expires   Call MD / Call 911  As directed    Comments:     If you experience chest pain or shortness of breath, CALL 911 and be transported to the hospital emergency room.  If you develope a fever above 101 F, pus (white drainage) or increased drainage or redness at the wound, or  calf pain, call your surgeon's office.   Change dressing  As directed    Comments:     Maintain surgical dressing for 10-14 days, or until follow up in the clinic.   Constipation Prevention  As directed    Comments:     Drink plenty of fluids.  Prune juice may be helpful.  You may use a stool softener, such as Colace (over the counter) 100 mg twice a day.  Use MiraLax (over the counter) for constipation as needed.   Diet - low sodium heart healthy  As directed     Discharge instructions  As directed    Comments:     Maintain surgical dressing for 10-14 days, or until follow up in the clinic. [facility] Call with any questions or concerns.   Increase activity slowly as tolerated  As directed    TED hose  As directed    Comments:     Use stockings (TED hose) for 2 weeks on both leg(s).  You may remove them at night for sleeping.   Weight bearing as tolerated  As directed    Questions:     Laterality:     Extremity:          Medication List    STOP taking these medications       acetaminophen 500 MG tablet  Commonly known as:  TYLENOL     meloxicam 15 MG tablet  Commonly known as:  MOBIC      TAKE these medications       aspirin 325 MG EC tablet  Take 1 tablet (325 mg total) by mouth 2 (two) times daily.     Biotin 5000 MCG Caps  Take 5,000 mcg by mouth every morning.     DSS 100 MG Caps  Take 100 mg by mouth 2 (two) times daily.     ferrous sulfate 325 (65 FE) MG tablet  Take 1 tablet (325 mg total) by mouth 3 (three) times daily after meals.     HYDROcodone-acetaminophen 7.5-325 MG per tablet  Commonly known as:  NORCO  Take 1-2 tablets by mouth every 4 (four) hours as needed for moderate pain.     methocarbamol 500 MG tablet  Commonly known as:  ROBAXIN  Take 1 tablet (500 mg total) by mouth every 6 (six) hours as needed for muscle spasms.     polyethylene glycol packet  Commonly known as:  MIRALAX / GLYCOLAX  Take 17 g by mouth 2 (two) times daily.     Resveratrol 100 MG Caps  Take 100 mg by mouth every morning.     sodium chloride 0.65 % Soln nasal spray  Commonly known as:  OCEAN  Place 2 sprays into both nostrils at bedtime.     traMADol 50 MG tablet  Commonly known as:  ULTRAM  Take 1-2 tablets (50-100 mg total) by mouth every 6 (six) hours as needed for moderate pain or severe pain.     VIACTIV 500-500-40 MG-UNT-MCG Chew  Generic drug:  Calcium-Vitamin D-Vitamin K    Chew 1 tablet by mouth 3 (three) times a week.     VITEYES AREDS FORMULA PO  Take 1 capsule by mouth every morning.         Signed: Anastasio AuerbachMatthew S. Najae Filsaime   PAC  01/30/2014, 4:55 PM

## 2014-01-30 NOTE — Progress Notes (Signed)
Occupational Therapy Treatment Patient Details Name: Elba Barmanatricia Vanwinkle MRN: [id number] DOB: 10/16/1956 Today's Date: 01/30/2014 Time: [id number] OT Time Calculation (min): 8 min  OT Assessment / Plan / Recommendation  History of present illness  pt was admitted for L THA, DA   OT comments  All education was completed. Pt plans home later today  Follow Up Recommendations  No OT follow up    Barriers to Discharge       Equipment Recommendations  None recommended by OT    Recommendations for Other Services    Frequency Min 2X/week   Progress towards OT Goals Progress towards OT goals: Progressing toward goals  Plan      Precautions / Restrictions Precautions Precautions: Fall Restrictions Weight Bearing Restrictions: No   Pertinent Vitals/Pain Soreness in L hip with weightbearing    ADL    Toilet Transfer: Min guard Toilet Transfer Method: Sit to stand Tub/Shower Transfer: Min guard Tub/Shower Transfer Method: Science writerAmbulating Tub/Shower Transfer Equipment: Walk in shower Transfers/Ambulation Related to ADLs: practiced shower transfer with min guard. Educated to use RW to get to built in seat ADL Comments: Pt had used commode with nursing.  She does not have anything to push up from. Educated to use toilet seat as well as middle of walker bar for leverage.     OT Diagnosis: Generalized weakness  OT Problem List: Decreased strength;Decreased activity tolerance;Cardiopulmonary status limiting activity;Pain OT Treatment Interventions: Self-care/ADL training;DME and/or AE instruction;Patient/family education   OT Goals(current goals can now be found in the care plan section) Acute Rehab OT Goals Patient Stated Goal: none stated OT Goal Formulation: With patient Time For Goal Achievement: 02/06/14 Potential to Achieve Goals: Good ADL Goals Pt Will Perform Grooming: with supervision;standing Pt Will Transfer to Toilet: with min guard assist;ambulating (high commode) Pt Will  Perform Tub/Shower Transfer: with min guard assist;ambulating;Shower transfer  Visit Information  Last OT Received On: 01/30/14 Assistance Needed: +1    Subjective Data      Prior Functioning  Home Living Family/patient expects to be discharged to:: Private residence Living Arrangements: Spouse/significant other Available Help at Discharge: Family Type of Home: House Home Access: Stairs to enter Secretary/administratorntrance Stairs-Number of Steps: 3 Entrance Stairs-Rails: None Home Layout: One level Home Equipment: Shower seat - built in Additional Comments: just replaced commode with comfort height Prior Function Level of Independence: Independent Communication Communication: No difficulties Dominant Hand: Right    Cognition  Cognition Arousal/Alertness: Awake/alert Behavior During Therapy: WFL for tasks assessed/performed Overall Cognitive Status: Within Functional Limits for tasks assessed    Mobility    Transfers Overall transfer level: Needs assistance Equipment used: Rolling walker (2 wheeled) Transfers: Sit to/from Stand Sit to Stand: Min guard General transfer comment: cues for hand placement    Exercises     Balance    End of Session OT - End of Session Activity Tolerance: Patient tolerated treatment well Patient left: in chair;with call bell/phone within reach;with family/visitor present  GO     Arshia Spellman 01/30/2014, 1:31 PM Marica OtterMaryellen Yalitza Teed, OTR/L [phone] 01/30/2014

## 2014-01-30 NOTE — Consult Note (Signed)
Spoke to patient and she stated that she already has equipment.

## 2014-01-30 NOTE — Progress Notes (Signed)
Physical Therapy Treatment Patient Details Name: Cheryl Stanley MRN: [id number] DOB: 07/22/1956 Today's Date: 01/30/2014 Time: [id number] PT Time Calculation (min): 23 min  PT Assessment / Plan / Recommendation  History of Present Illness     PT Comments   Pt and spouse educated on stairs, home therex and car transfers.  Handouts provided  Follow Up Recommendations  Home health PT     Does the patient have the potential to tolerate intense rehabilitation     Barriers to Discharge        Equipment Recommendations  None recommended by PT    Recommendations for Other Services OT consult  Frequency 7X/week   Progress towards PT Goals Progress towards PT goals: Progressing toward goals  Plan Current plan remains appropriate    Precautions / Restrictions Precautions Precautions: Fall Restrictions Weight Bearing Restrictions: No   Pertinent Vitals/Pain 2/10    Mobility  Bed Mobility Overal bed mobility: Needs Assistance Bed Mobility: Supine to Sit Supine to sit: Min assist General bed mobility comments: cues for sequence and use of R LE to self assist Transfers Overall transfer level: Needs assistance Equipment used: Rolling walker (2 wheeled) Transfers: Sit to/from Stand Sit to Stand: Supervision General transfer comment: cues for hand placement Ambulation/Gait Ambulation/Gait assistance: Supervision Ambulation Distance (Feet): 150 Feet Assistive device: Rolling walker (2 wheeled) Gait Pattern/deviations: Step-to pattern;Step-through pattern;Shuffle;Trunk flexed General Gait Details: min cues for posture and position from RW Stairs: Yes    Exercises Total Joint Exercises Ankle Circles/Pumps: AROM;15 reps;Supine;Both Quad Sets: AROM;Both;10 reps;Supine Heel Slides: AAROM;Left;15 reps;Supine Hip ABduction/ADduction: AAROM;Left;15 reps;Supine   PT Diagnosis: Difficulty walking  PT Problem List: Decreased strength;Decreased range of motion;Decreased activity  tolerance;Decreased mobility;Decreased knowledge of use of DME;Pain PT Treatment Interventions: DME instruction;Gait training;Stair training;Functional mobility training;Therapeutic activities;Therapeutic exercise;Patient/family education   PT Goals (current goals can now be found in the care plan section) Acute Rehab PT Goals Patient Stated Goal: none stated PT Goal Formulation: With patient Time For Goal Achievement: 02/06/14 Potential to Achieve Goals: Good  Visit Information  Last PT Received On: 01/30/14 Assistance Needed: +1    Subjective Data  Patient Stated Goal: none stated   Cognition  Cognition Arousal/Alertness: Awake/alert Behavior During Therapy: WFL for tasks assessed/performed Overall Cognitive Status: Within Functional Limits for tasks assessed    Balance     End of Session PT - End of Session Equipment Utilized During Treatment: Gait belt Activity Tolerance: Patient tolerated treatment well Patient left: in chair;with call bell/phone within reach;with family/visitor present Nurse Communication: Mobility status   GP     Cheryl Stanley 01/30/2014, 3:00 PM

## 2014-01-30 NOTE — Care Management Note (Signed)
    Page 1 of 2   01/30/2014     2:25:18 PM   CARE MANAGEMENT NOTE 01/30/2014  Patient:  Cheryl Stanley,Cheryl Stanley   Account Number:  [id number]  Date Initiated:  01/30/2014  Documentation initiated by:  Colleen CanMANNING,Elajah Kunsman  Subjective/Objective Assessment:   dx hip replacemnt-left anterior     Action/Plan:   CM spoke with patient and spouse. Plans plans [place] [facility] where spouse will caregiver. She already has RW. [facility].   Anticipated DC Date:  01/30/2014   Anticipated DC Plan:  HOME W HOME HEALTH SERVICES      DC Planning Services  CM consult  DC out of service area      [facility]   Choice offered to / List presented to:  C-1 Patient        HH arranged  HH-2 PT      HH agency  OTHER - SEE NOTE   Status of service:  Completed, signed off Medicare Important Message given?   (If response is "NO", the following Medicare IM given date fields will be blank) Date Medicare IM given:   Date Additional Medicare IM given:    Discharge Disposition:  HOME W HOME HEALTH SERVICES  Per UR Regulation:  Reviewed for med. necessity/level of care/duration of stay  If discussed at Long Length of Stay Meetings, dates discussed:    Comments:  01/30/2014 Colleen CanLinda Anakaren Campion BSN RN CCM [phone] CM [place] [facility] [place] [phone]); spoke with intake-Chuck who advised that they were in network with patient's insurance and could provide HHpt services with start date of Friday 02/01/2014 if discharged today. Sent HH orders, op note, H&P, face sheet, face to face, to [phone]; confirmation received. Advised patient of the above and gave [facility].

## 2014-01-30 NOTE — Evaluation (Signed)
Physical Therapy Evaluation Patient Details Name: Cheryl Stanley MRN: [id number] DOB: 06/18/1956 Today's Date: 01/30/2014 Time: [id number] PT Time Calculation (min): 20 min  PT Assessment / Plan / Recommendation History of Present Illness     Clinical Impression  Pt s/p L THR presents with decreased L LE strength/ROM and post op pain limiting functional mobility.  Pt should progress to d/c home with family assist and HHPT follow up.    PT Assessment  Patient needs continued PT services    Follow Up Recommendations  Home health PT    Does the patient have the potential to tolerate intense rehabilitation      Barriers to Discharge        Equipment Recommendations  None recommended by PT    Recommendations for Other Services OT consult   Frequency 7X/week    Precautions / Restrictions Precautions Precautions: Fall Restrictions Weight Bearing Restrictions: No   Pertinent Vitals/Pain 2/10; "its mostly just stiff"      Mobility  Bed Mobility Overal bed mobility: Needs Assistance Bed Mobility: Supine to Sit Supine to sit: Min assist General bed mobility comments: cues for sequence and use of R LE to self assist Transfers Overall transfer level: Needs assistance Equipment used: Rolling walker (2 wheeled) Transfers: Sit to/from Stand Sit to Stand: Min guard General transfer comment: cues for hand placement Ambulation/Gait Ambulation/Gait assistance: Min guard Ambulation Distance (Feet): 140 Feet Assistive device: Rolling walker (2 wheeled) Gait Pattern/deviations: Step-to pattern;Step-through pattern;Shuffle;Decreased step length - right;Decreased step length - left General Gait Details: cues for posture, position from RW, initial sequence and progression to recip gait    Exercises Total Joint Exercises Ankle Circles/Pumps: AROM;15 reps;Supine;Both Quad Sets: AROM;Both;10 reps;Supine Heel Slides: AAROM;Left;15 reps;Supine Hip ABduction/ADduction: AAROM;Left;15  reps;Supine   PT Diagnosis: Difficulty walking  PT Problem List: Decreased strength;Decreased range of motion;Decreased activity tolerance;Decreased mobility;Decreased knowledge of use of DME;Pain PT Treatment Interventions: DME instruction;Gait training;Stair training;Functional mobility training;Therapeutic activities;Therapeutic exercise;Patient/family education     PT Goals(Current goals can be found in the care plan section) Acute Rehab PT Goals Patient Stated Goal: none stated PT Goal Formulation: With patient Time For Goal Achievement: 02/06/14 Potential to Achieve Goals: Good  Visit Information  Last PT Received On: 01/30/14 Assistance Needed: +1       Prior Functioning  Home Living Family/patient expects to be discharged to:: Private residence Living Arrangements: Spouse/significant other Available Help at Discharge: Family Type of Home: House Home Access: Stairs to enter Secretary/administratorntrance Stairs-Number of Steps: 3 Entrance Stairs-Rails: None Home Layout: One level Home Equipment: Shower seat - built in Additional Comments: just replaced commode with comfort height Prior Function Level of Independence: Independent Communication Communication: No difficulties Dominant Hand: Right    Cognition  Cognition Arousal/Alertness: Awake/alert Behavior During Therapy: WFL for tasks assessed/performed Overall Cognitive Status: Within Functional Limits for tasks assessed    Extremity/Trunk Assessment Upper Extremity Assessment Upper Extremity Assessment: Overall WFL for tasks assessed Lower Extremity Assessment Lower Extremity Assessment: LLE deficits/detail LLE Deficits / Details: Hip strength 2+/5 with AAROM at hip to 90 flex and 20 abd Cervical / Trunk Assessment Cervical / Trunk Assessment: Normal   Balance    End of Session PT - End of Session Equipment Utilized During Treatment: Gait belt Activity Tolerance: Patient tolerated treatment well Patient left: in chair;with  call bell/phone within reach;with family/visitor present Nurse Communication: Mobility status  GP     Audrinna Sherman 01/30/2014, 12:48 PM

## 2014-11-01 IMAGING — CR DG PORTABLE PELVIS
1 series · 1 of 1 positions shown · non-contrast
Comparison: None.

CLINICAL DATA: Left hip replacement.

EXAM:
PORTABLE PELVIS 1-2 VIEWS

[AP]
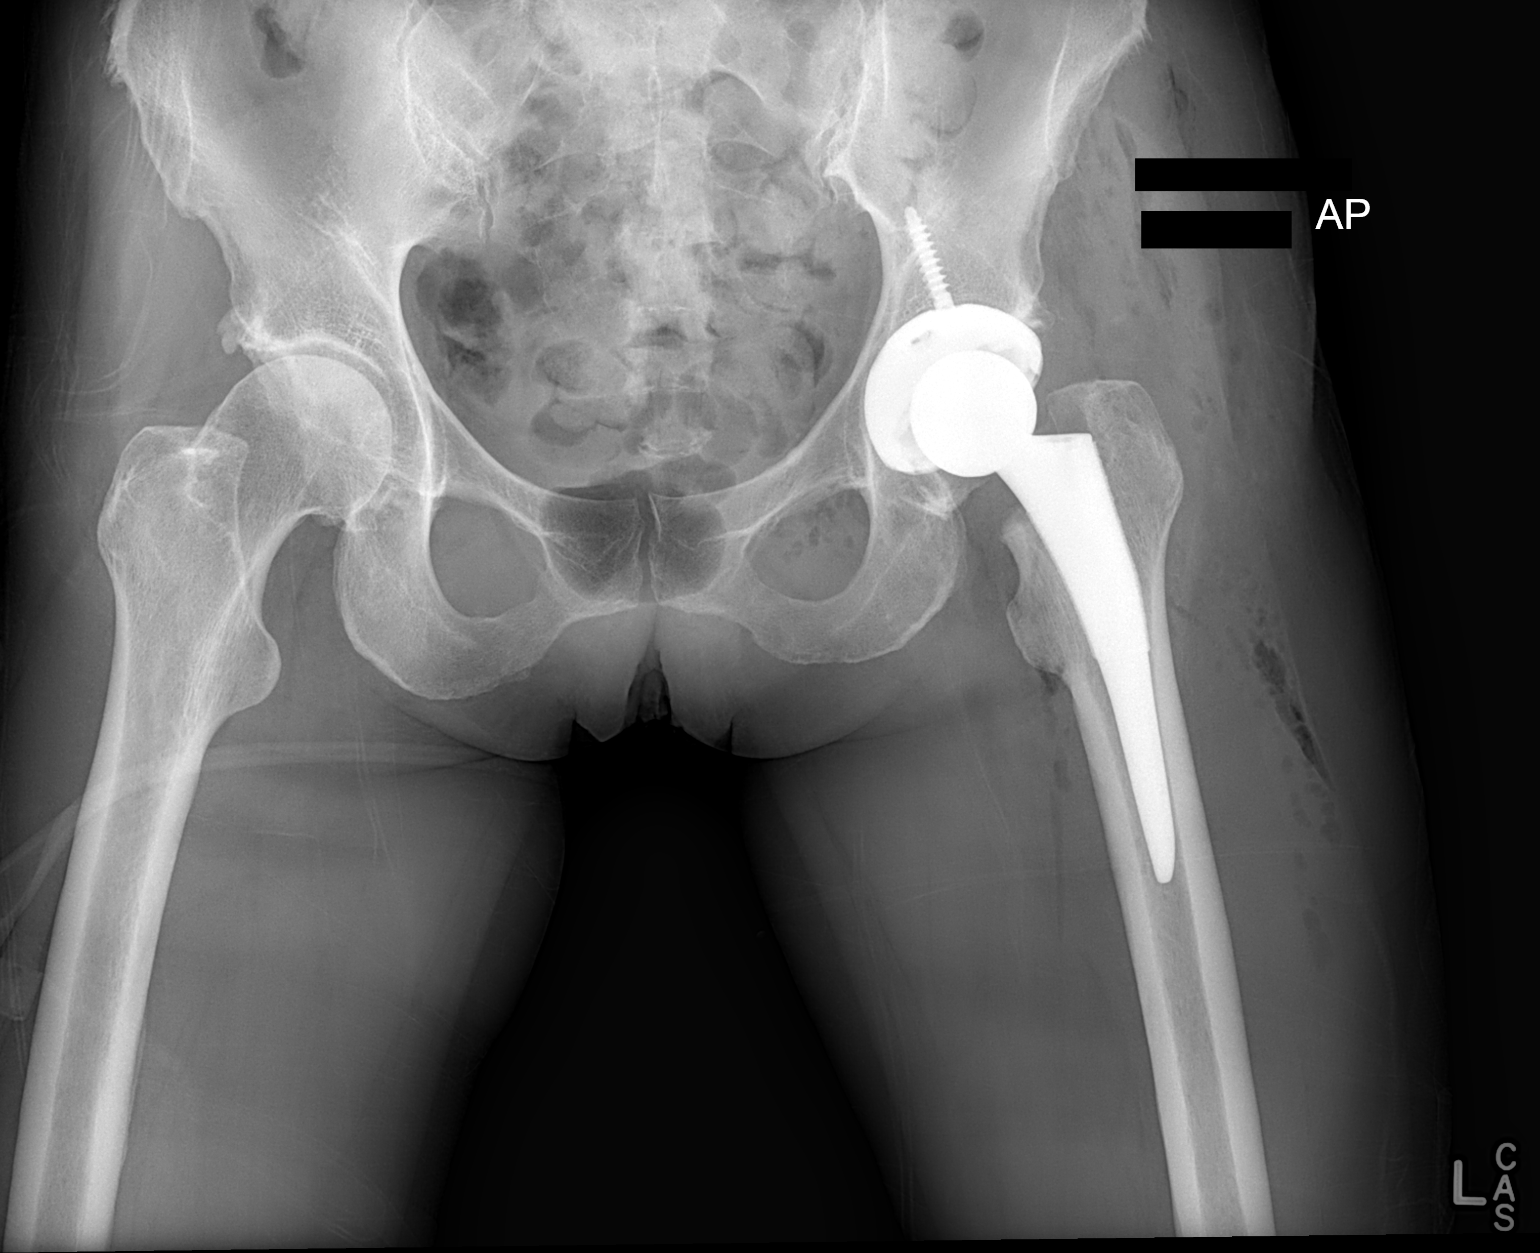

[1 of 1 positions shown; findings below may reference images not displayed]

FINDINGS: The patient has a new left total hip arthroplasty. The device is
located. There is no fracture. Gas the soft tissues from surgery is
noted. Right hip osteoarthritis is seen.
IMPRESSION: Left total hip replacement without evidence of complication
